# Patient Record
Sex: Male | Born: 1995 | Race: White | Hispanic: No | Marital: Single | Smoking: Never smoker
Health system: Southern US, Community
[De-identification: ages and names within clinical notes are randomized; demographics above are authoritative.]

---

## 2017-05-08 ENCOUNTER — Other Ambulatory Visit: Payer: Self-pay | Admitting: Family Medicine

## 2017-05-08 DIAGNOSIS — N50819 Testicular pain, unspecified: Secondary | ICD-10-CM

## 2017-05-12 ENCOUNTER — Ambulatory Visit: Payer: PRIVATE HEALTH INSURANCE

## 2017-05-16 ENCOUNTER — Ambulatory Visit
Admission: RE | Admit: 2017-05-16 | Discharge: 2017-05-16 | Disposition: A | Discharge status: Home / Self Care | Payer: PRIVATE HEALTH INSURANCE | Source: Ambulatory Visit | Attending: Family Medicine | Admitting: Family Medicine

## 2017-05-16 DIAGNOSIS — N509 Disorder of male genital organs, unspecified: Secondary | ICD-10-CM | POA: Diagnosis not present

## 2017-05-16 DIAGNOSIS — N50819 Testicular pain, unspecified: Secondary | ICD-10-CM

## 2017-09-24 IMAGING — US US SCROTUM
1 series · 14 of 25 positions shown · non-contrast
Comparison: None.

CLINICAL DATA: Initial evaluation for right testicular pain/ lump
for 2 weeks.

EXAM:
ULTRASOUND OF SCROTUM
TECHNIQUE: Complete ultrasound examination of the testicles, epididymis, and
other scrotal structures was performed.

[Series 1: us scrotum · 0.08mm/px · 14 of 55 slices shown]
[im 1/55]
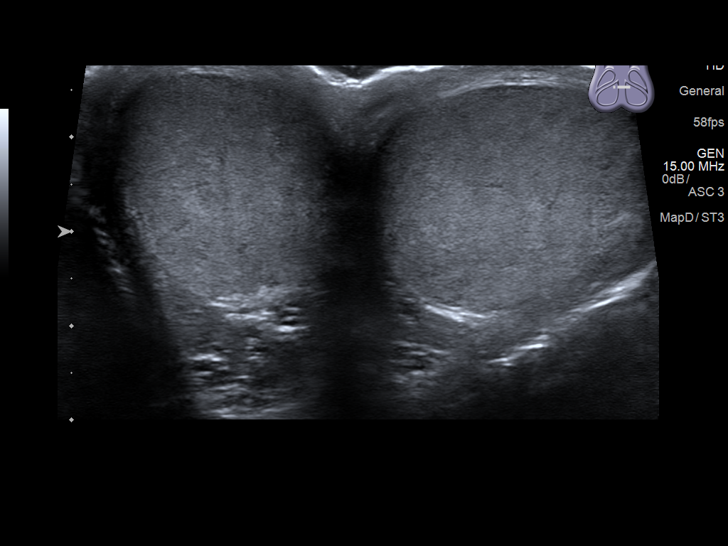
[im 5/55]
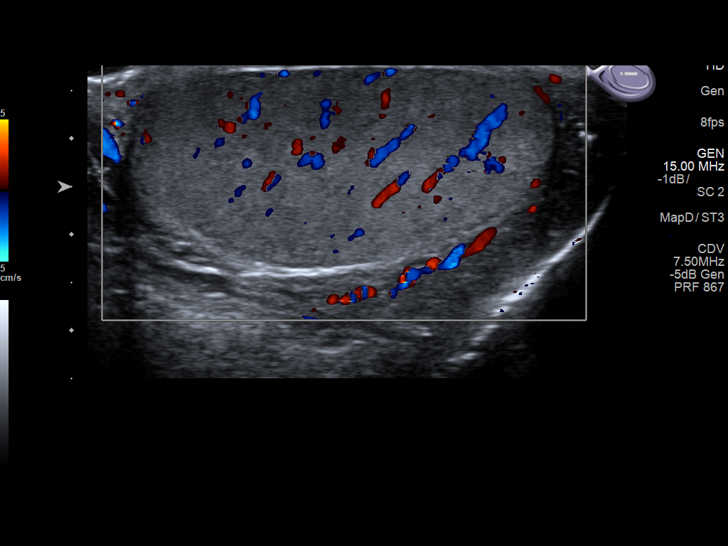
[im 10/55]
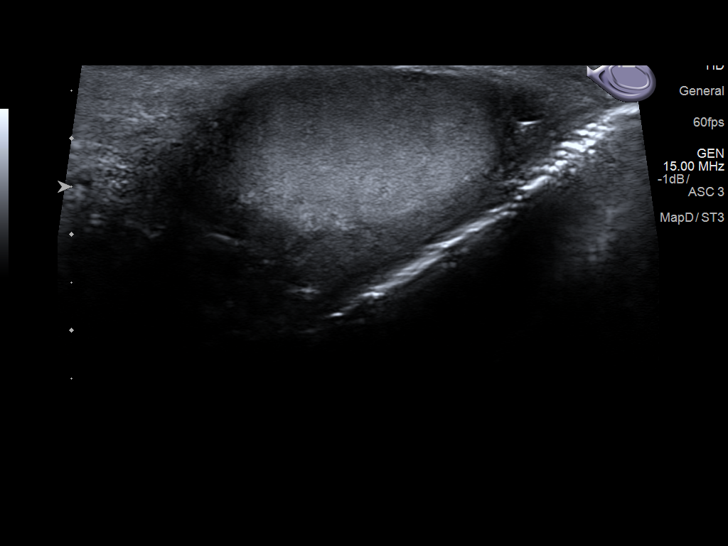
[im 14/55]
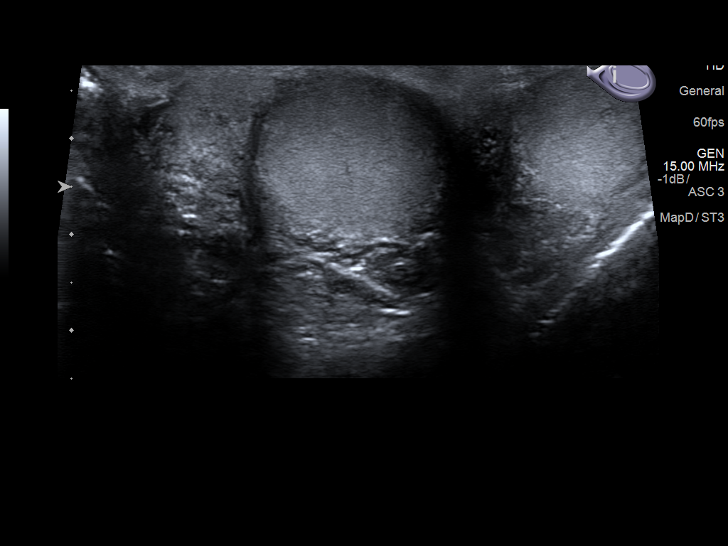
[im 19/55]
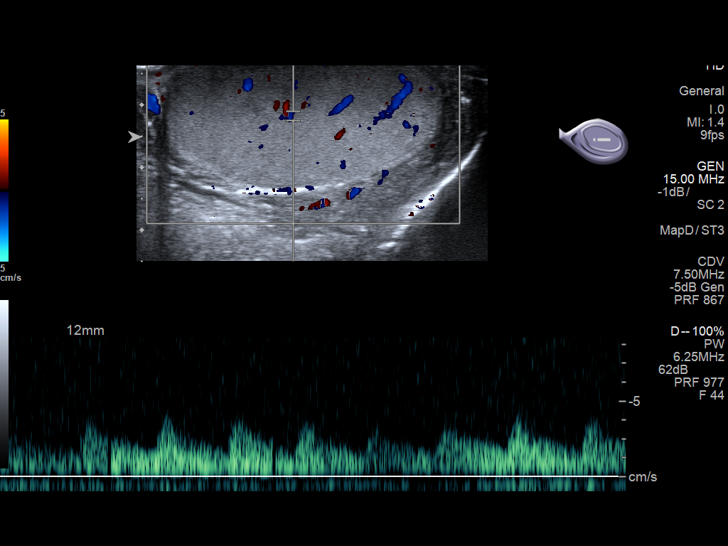
[im 21/55]
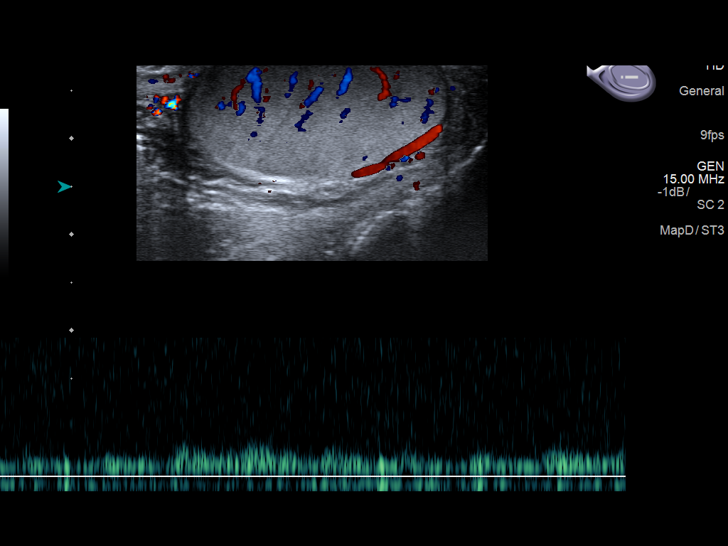
[im 25/55]
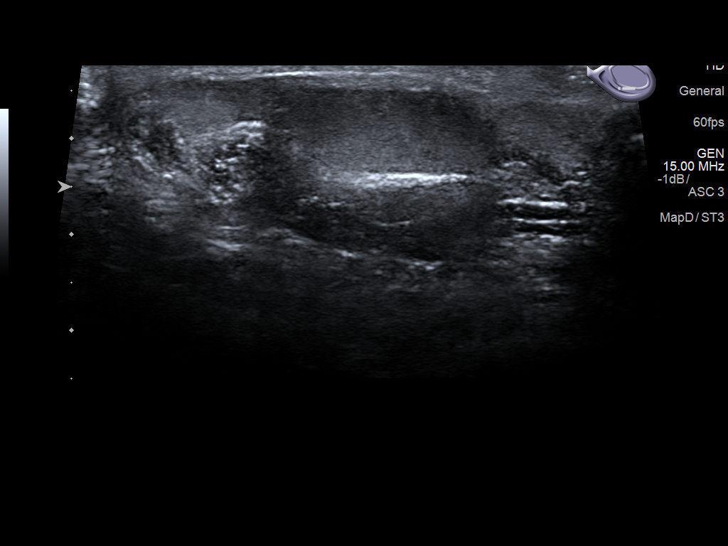
[im 30/55]
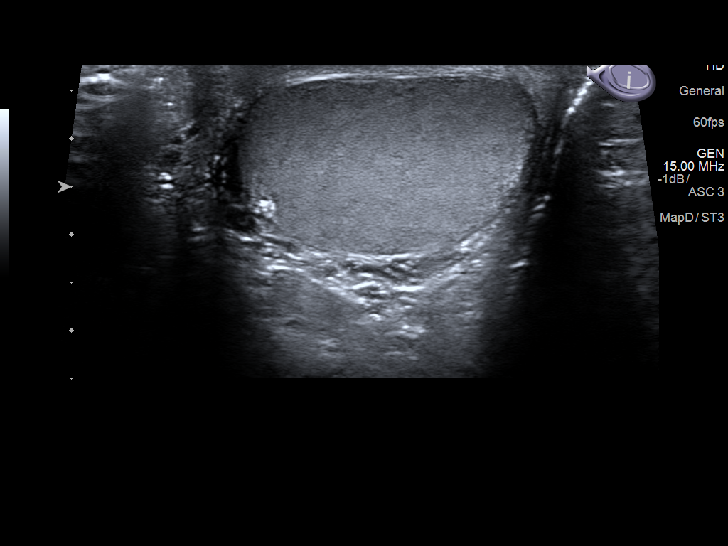
[im 34/55]
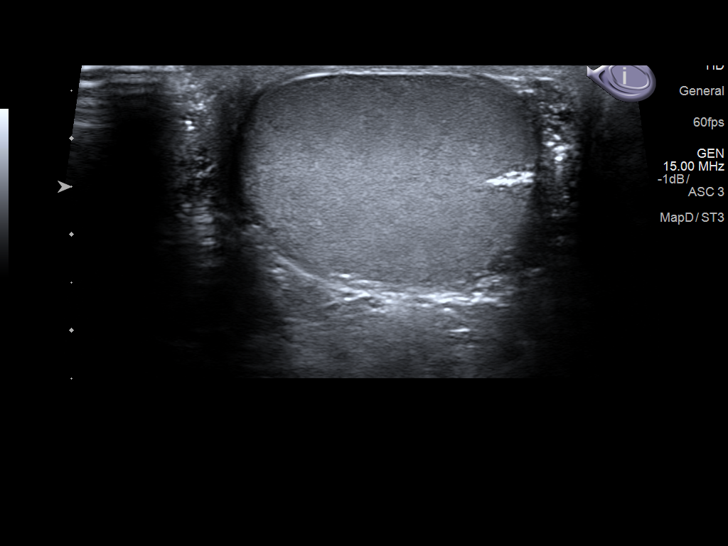
[im 37/55]
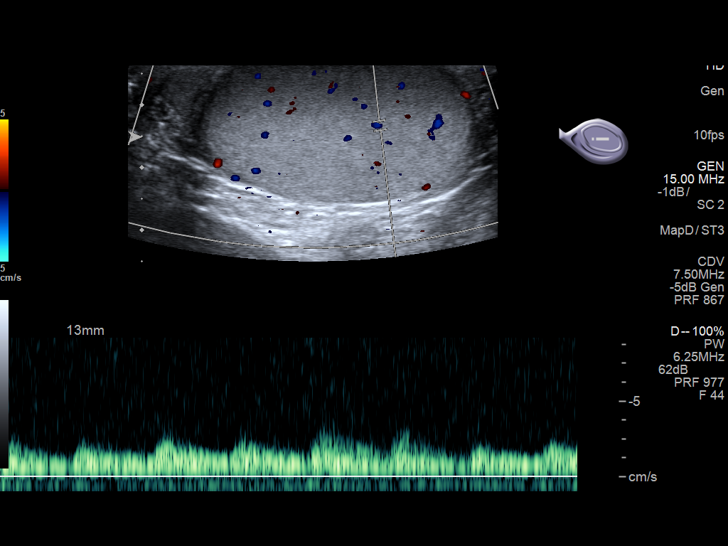
[im 41/55]
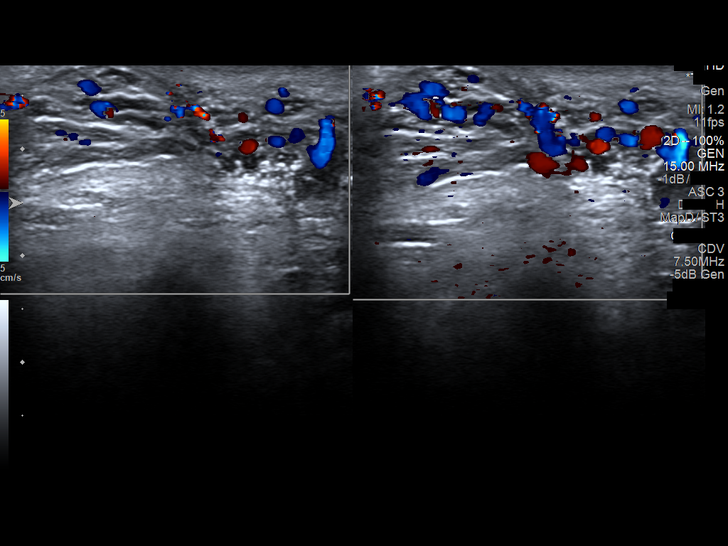
[im 46/55]
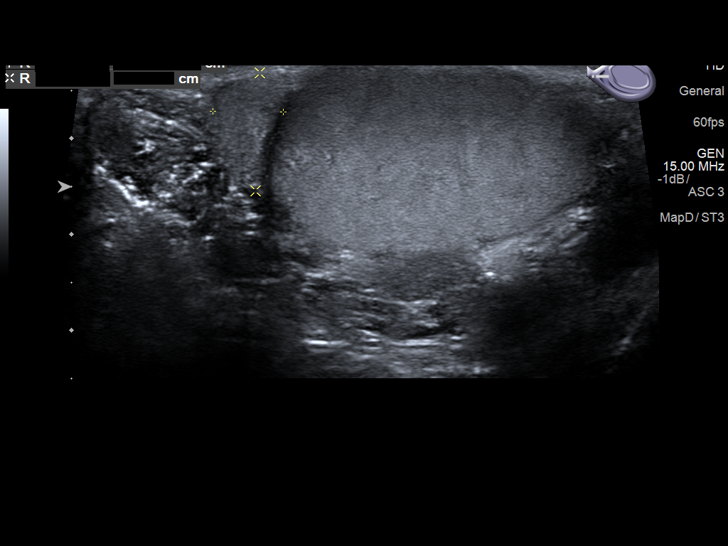
[im 50/55]
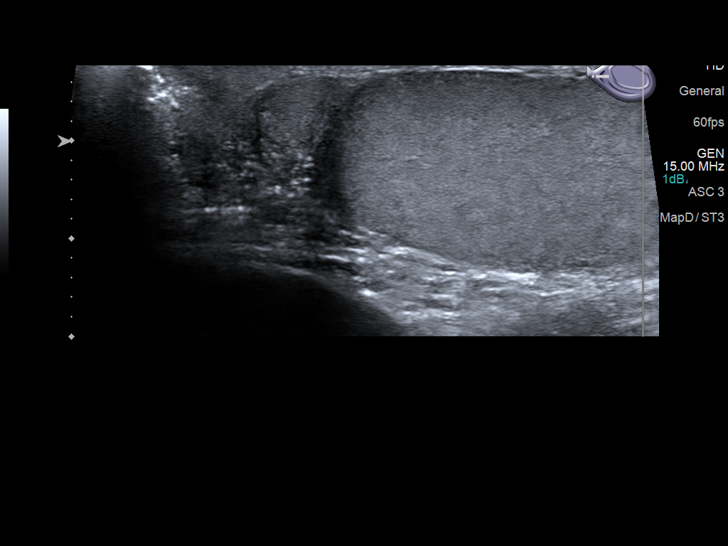
[im 55/55]
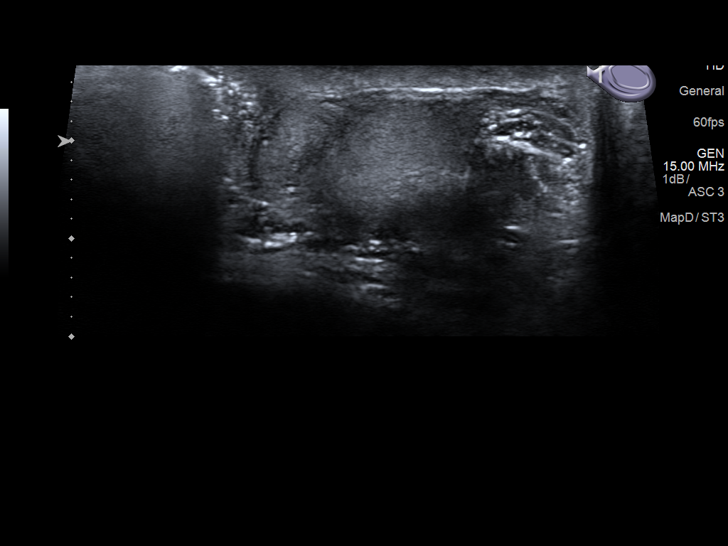

[14 of 25 positions shown; findings below may reference images not displayed]

FINDINGS: Right testicle

Measurements: 4.4 x 2.2 x 2.9 cm. No mass or microlithiasis
visualized.

Left testicle

Measurements: 4.3 x 2.1 x 3.0 cm. No mass or microlithiasis
visualized.

Right epididymis:  Normal in size and appearance.

Left epididymis:  Normal in size and appearance.

Hydrocele:  None visualized.

Varicocele:  None visualized.
IMPRESSION: Negative. No evidence for testicular mass or other significant
abnormality.

## 2017-11-08 ENCOUNTER — Encounter: Payer: Self-pay | Admitting: Emergency Medicine

## 2017-11-08 ENCOUNTER — Other Ambulatory Visit: Payer: Self-pay

## 2017-11-08 DIAGNOSIS — N4889 Other specified disorders of penis: Secondary | ICD-10-CM | POA: Diagnosis present

## 2017-11-08 NOTE — ED Triage Notes (Addendum)
Patient ambulatory to triage with steady gait, without difficulty, pt very restless, appears anxious; "my penis is shriveling up and turning green and I can't feel it"; no discoloration noted or swelling to penis and testicles; small sore noted on shaft; no drainage; pt frequently pulling penis out in triage room, looking at it; pt denies any injury; EMS st pt reported he had oral sex with someone who had a cold sore and now with the penile symptoms

## 2017-11-09 ENCOUNTER — Emergency Department
Admission: EM | Admit: 2017-11-09 | Discharge: 2017-11-09 | Disposition: A | Discharge status: Home / Self Care | Payer: PRIVATE HEALTH INSURANCE | Attending: Emergency Medicine | Admitting: Emergency Medicine

## 2017-11-09 DIAGNOSIS — N4889 Other specified disorders of penis: Secondary | ICD-10-CM | POA: Diagnosis not present

## 2017-11-09 LAB — CHLAMYDIA/NGC RT PCR (ARMC ONLY)
CHLAMYDIA TR: NOT DETECTED
N GONORRHOEAE: NOT DETECTED

## 2017-11-09 LAB — URINALYSIS, COMPLETE (UACMP) WITH MICROSCOPIC
BACTERIA UA: NONE SEEN
BILIRUBIN URINE: NEGATIVE
Glucose, UA: NEGATIVE mg/dL
KETONES UR: NEGATIVE mg/dL
Leukocytes, UA: NEGATIVE
Nitrite: NEGATIVE
PH: 6 (ref 5.0–8.0)
PROTEIN: NEGATIVE mg/dL
Specific Gravity, Urine: 1.025 (ref 1.005–1.030)

## 2017-11-09 LAB — URINE DRUG SCREEN, QUALITATIVE (ARMC ONLY)
AMPHETAMINES, UR SCREEN: NOT DETECTED
BENZODIAZEPINE, UR SCRN: NOT DETECTED
Barbiturates, Ur Screen: NOT DETECTED
Cannabinoid 50 Ng, Ur ~~LOC~~: NOT DETECTED
Cocaine Metabolite,Ur ~~LOC~~: NOT DETECTED
MDMA (Ecstasy)Ur Screen: NOT DETECTED
METHADONE SCREEN, URINE: NOT DETECTED
Opiate, Ur Screen: NOT DETECTED
PHENCYCLIDINE (PCP) UR S: NOT DETECTED
TRICYCLIC, UR SCREEN: NOT DETECTED

## 2017-11-09 MED ORDER — LORAZEPAM 1 MG PO TABS
1.0000 mg | ORAL_TABLET | Freq: Once | ORAL | Status: AC
  Administered 2017-11-09: 1 mg via ORAL
  Filled 2017-11-09: qty 1

## 2017-11-09 NOTE — ED Notes (Signed)
Spoke with Dr Manson PasseyBrown regarding pt's c/o; no orders given at this time

## 2017-11-09 NOTE — ED Provider Notes (Addendum)
[facility] Emergency Department Provider Note __   First MD Initiated Contact with Patient 11/09/17 [id number]     (approximate)  I have reviewed the triage vital signs and the nursing notes.   HISTORY  Chief Complaint Groin Pain    HPI Matthew Silva is a 22 y.o. male presented emergency department with penile discomfort scribed as burning/stinging with very light touch such as clothing rubbing against the penis.  Patient states that he received oral sex 1 week ago from young lady who had a "cold sore".  Patient denies any vaginal intercourse at that time.  Patient states while in the shower tonight he noted an area red tender area to the glans penis in the area where he has had stinging and burning sensation.  Patient denies any blistering.  Patient denies any dysuria  Past medical history None There are no active problems to display for this patient.  Past surgical history None  Prior to Admission medications   Not on File    Allergies Amoxicillin  No family history on file.  Social History Social History   Tobacco Use  . Smoking status: Never Smoker  . Smokeless tobacco: Never Used  Substance Use Topics  . Alcohol use: Not on file  . Drug use: Not on file    Review of Systems Constitutional: No fever/chills Eyes: No visual changes. ENT: No sore throat. Cardiovascular: Denies chest pain. Respiratory: Denies shortness of breath. Gastrointestinal: No abdominal pain.  No nausea, no vomiting.  No diarrhea.  No constipation. Genitourinary: Negative for dysuria. Musculoskeletal: Negative for neck pain.  Negative for back pain. Integumentary: Negative for rash. Neurological: Negative for headaches, focal weakness or numbness.   ____________________________________________   PHYSICAL EXAM:  VITAL SIGNS: ED Triage Vitals [11/08/17 2343]  Enc Vitals Group     BP (!) 157/61     Pulse Rate (!) 129     Resp      Temp 98.6 F (37 C)       Temp Source Oral     SpO2 99 %     Weight 61.2 kg (135 lb)     Height 1.727 m (5\' 8" )     Head Circumference      Peak Flow      Pain Score      Pain Loc      Pain Edu?      Excl. in GC?     Constitutional: Alert and oriented.  Very anxious affect eyes: Conjunctivae are normal.  Head: Atraumatic. Mouth/Throat: Mucous membranes are moist.  Oropharynx non-erythematous. Neck: No stridor.   Cardiovascular: Normal rate, regular rhythm. Good peripheral circulation. Grossly normal heart sounds. Respiratory: Normal respiratory effort.  No retractions. Lungs CTAB. Genitourinary: Small subcentimeter area of erythema noted to the glans penis no blistering.  Area very sensitive to palpation Gastrointestinal: Soft and nontender. No distention.  Musculoskeletal: No lower extremity tenderness nor edema. No gross deformities of extremities. Neurologic:  Normal speech and language. No gross focal neurologic deficits are appreciated.  Skin:  Skin is warm, dry and intact. No rash noted. Psychiatry: Very anxious affect  ____________________________________________   LABS (all labs ordered are listed, but only abnormal results are displayed)  Labs Reviewed  URINALYSIS, COMPLETE (UACMP) WITH MICROSCOPIC - Abnormal; Notable for the following components:      Result Value   Color, Urine YELLOW (*)    APPearance HAZY (*)    Hgb urine dipstick SMALL (*)    Squamous Epithelial /  LPF 0-5 (*)    All other components within normal limits  CHLAMYDIA/NGC RT PCR ([facility] ONLY)  URINE DRUG SCREEN, QUALITATIVE ([facility] ONLY)  HERPES SIMPLEX VIRUS(HSV) DNA BY PCR      Procedures   ____________________________________________   INITIAL IMPRESSION / ASSESSMENT AND PLAN / ED COURSE  As part of my medical decision making, I reviewed the following data within the electronic MEDICAL RECORD NUMBER   22 year old male presenting to the emergency department with above-stated history and physical exam  secondary to penile discomfort following oral sex.  Concern for possible HSV 1 genital infection despite characteristic blistering rash.  I spoke to the patient regarding the fact that discomfort may precede the rash..  Appropriate blood work obtained to check for HSV.  Patient very anxious with worsening anxiety/panic attack while in the emergency department.  Patient gave permission for me to speak with his mother which I did and I informed her that I would thought it was within the patient's best interest to receive an anxiolytic at this time giving worsening anxiety.  Patient's mother agreed and as such patient was given Ativan 1 mg tablet.  Patient is resting comfortably at this time.  I will asked Dr. Alphonzo Lemmings to reevaluate the patient if anxiety/panic attack remains severe upon awakening would recommend psychiatric evaluation.  Patient's anxiety is markedly improved patient should be discharged home with recommendation to follow-up blood test been performed ____________________________________________  FINAL CLINICAL IMPRESSION(S) / ED DIAGNOSES  Final diagnoses:  Penile pain     MEDICATIONS GIVEN DURING THIS VISIT:  Medications - No data to display   ED Discharge Orders    None       Note:  This document was prepared using Dragon voice recognition software and may include unintentional dictation errors.    Darci Current, MD 11/09/17 1610    Darci Current, MD 11/09/17 [phone]

## 2017-11-10 LAB — HERPES SIMPLEX VIRUS(HSV) DNA BY PCR
HSV 1 DNA: NEGATIVE
HSV 2 DNA: NEGATIVE

## 2017-11-14 NOTE — ED Notes (Signed)
Patient called asking for results.  I gave him neg results for stds.  He says he continues to have irritation, but agrees to see his pcp this week.  I told him that we did not test for syphilis and he should tell pcp that.
